# Patient Record
Sex: Female | Born: 1995 | Race: Black or African American | Hispanic: No | Marital: Single
Health system: Midwestern US, Community
[De-identification: ages and names within clinical notes are randomized; demographics above are authoritative.]

## PROBLEM LIST (undated history)

## (undated) DIAGNOSIS — A749 Chlamydial infection, unspecified: Secondary | ICD-10-CM

---
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
## 2015-05-25 ENCOUNTER — Emergency Department (HOSPITAL_COMMUNITY): Payer: Medicaid Other

## 2015-05-25 ENCOUNTER — Encounter (HOSPITAL_COMMUNITY): Payer: Self-pay

## 2015-05-25 ENCOUNTER — Emergency Department (HOSPITAL_COMMUNITY)
Admission: EM | Admit: 2015-05-25 | Discharge: 2015-05-25 | Disposition: A | Discharge status: Home / Self Care | Payer: Medicaid Other | Attending: Emergency Medicine | Admitting: Emergency Medicine

## 2015-05-25 DIAGNOSIS — S3992XA Unspecified injury of lower back, initial encounter: Secondary | ICD-10-CM | POA: Diagnosis present

## 2015-05-25 DIAGNOSIS — Y998 Other external cause status: Secondary | ICD-10-CM | POA: Diagnosis not present

## 2015-05-25 DIAGNOSIS — S39012A Strain of muscle, fascia and tendon of lower back, initial encounter: Secondary | ICD-10-CM | POA: Diagnosis not present

## 2015-05-25 DIAGNOSIS — Y9389 Activity, other specified: Secondary | ICD-10-CM | POA: Insufficient documentation

## 2015-05-25 DIAGNOSIS — Z3202 Encounter for pregnancy test, result negative: Secondary | ICD-10-CM | POA: Insufficient documentation

## 2015-05-25 DIAGNOSIS — Y92410 Unspecified street and highway as the place of occurrence of the external cause: Secondary | ICD-10-CM | POA: Diagnosis not present

## 2015-05-25 LAB — URINALYSIS, ROUTINE W REFLEX MICROSCOPIC
BILIRUBIN URINE: NEGATIVE
GLUCOSE, UA: NEGATIVE mg/dL
KETONES UR: NEGATIVE mg/dL
NITRITE: NEGATIVE
PH: 6 (ref 5.0–8.0)
Protein, ur: NEGATIVE mg/dL
Specific Gravity, Urine: 1.039 — ABNORMAL HIGH (ref 1.005–1.030)
Urobilinogen, UA: 1 mg/dL (ref 0.0–1.0)

## 2015-05-25 LAB — URINE MICROSCOPIC-ADD ON

## 2015-05-25 LAB — PREGNANCY, URINE: PREG TEST UR: NEGATIVE
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
## 2015-05-25 NOTE — Discharge Instructions (Signed)

## 2016-03-03 ENCOUNTER — Inpatient Hospital Stay
Admit: 2016-03-03 | Discharge: 2016-03-03 | Disposition: A | Discharge status: Home / Self Care | Payer: Self-pay | Attending: Emergency Medicine

## 2016-03-03 DIAGNOSIS — R102 Pelvic and perineal pain: Secondary | ICD-10-CM

## 2016-03-03 LAB — URINALYSIS W/ REFLEX CULTURE
Bacteria: NEGATIVE /hpf
Bilirubin: NEGATIVE
Blood: NEGATIVE
Glucose: NEGATIVE mg/dL
Ketone: NEGATIVE mg/dL
Leukocyte Esterase: NEGATIVE
Nitrites: NEGATIVE
Protein: NEGATIVE mg/dL
Specific gravity: 1.016 (ref 1.003–1.030)
Urobilinogen: 0.2 EU/dL (ref 0.2–1.0)
pH (UA): 7.5 (ref 5.0–8.0)

## 2016-03-03 LAB — HCG URINE, QL. - POC: Pregnancy test,urine (POC): NEGATIVE

## 2016-03-03 NOTE — ED Provider Notes (Signed)
Patient is a 20 y.o. female presenting with pelvic pain. The history is provided by the patient.   Pelvic Pain    This is a recurrent problem. The current episode started 6 to 12 hours ago. The problem occurs constantly. The problem has been gradually worsening. The pain is associated with an unknown factor. The pain is located in the LLQ and RLQ. The quality of the pain is dull and cramping. The pain is at a severity of 8/10. The pain is severe. Associated symptoms include back pain. Pertinent negatives include no belching, no diarrhea, no nausea, no vomiting, no constipation, no dysuria, no frequency and no chest pain. Nothing worsens the pain. The pain is relieved by nothing. Past workup includes ultrasound.        Past Medical History:   Diagnosis Date   ??? Psychiatric disorder     panic attack, anxiety       No past surgical history on file.      No family history on file.    Social History     Social History   ??? Marital status: SINGLE     Spouse name: N/A   ??? Number of children: N/A   ??? Years of education: N/A     Occupational History   ??? Not on file.     Social History Main Topics   ??? Smoking status: Never Smoker   ??? Smokeless tobacco: Not on file   ??? Alcohol use No   ??? Drug use: Not on file   ??? Sexual activity: Not on file     Other Topics Concern   ??? Not on file     Social History Narrative         ALLERGIES: Review of patient's allergies indicates no known allergies.    Review of Systems   Constitutional: Positive for activity change.   HENT: Negative.    Eyes: Negative.    Respiratory: Negative.    Cardiovascular: Negative for chest pain.   Gastrointestinal: Negative for constipation, diarrhea, nausea and vomiting.   Endocrine: Negative.    Genitourinary: Positive for flank pain, menstrual problem and pelvic pain. Negative for dysuria and frequency.   Musculoskeletal: Positive for back pain.   Skin: Negative.    Allergic/Immunologic: Negative.    Neurological: Negative.    Hematological: Negative.     Psychiatric/Behavioral: Negative.        Vitals:    03/03/16 1440   BP: 134/82   Pulse: (!) 101   Resp: 19   Temp: 98.6 ??F (37 ??C)   SpO2: 99%   Weight: 62.1 kg (136 lb 14.5 oz)   Height:  (1.702 m)            Physical Exam   Constitutional: She is oriented to person, place, and time. She appears well-developed and well-nourished.   HENT:   Head: Normocephalic and atraumatic.   Cardiovascular: Normal rate and regular rhythm.    Pulmonary/Chest: Effort normal.   Abdominal: Soft. Bowel sounds are normal.   Musculoskeletal: Normal range of motion.   Neurological: She is alert and oriented to person, place, and time.   Skin: Skin is warm and dry.   Psychiatric: She has a normal mood and affect. Her behavior is normal. Judgment and thought content normal.   Nursing note and vitals reviewed.       MDM  Number of Diagnoses or Management Options  Pelvic pain:   Diagnosis management comments:     Pelvic Pain  LMP: January 23, 2016  UA  UPT: Negative  GC/Chlam: Pending    Seen by & discussed with Dr. Janace ArisM. Bartholowmew    Follow-up with PCP  Financial Screening needed      ED Course       Procedures

## 2016-03-03 NOTE — Progress Notes (Signed)
Attempted to reach patient. Unable to complete call due to restrictions on line per message.  Will send registered letter

## 2016-03-03 NOTE — ED Triage Notes (Signed)
Pain in pelvis from ovarian cyst started around 10 AM today, reports it always comes and goes but today it is constant. Reports pain on both sides.

## 2016-03-07 LAB — CHLAMYDIA/GC PCR
Chlamydia amplified: POSITIVE — AB
N. gonorrhea, amplified: NEGATIVE
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
## 2018-01-04 ENCOUNTER — Inpatient Hospital Stay
Admit: 2018-01-04 | Discharge: 2018-01-04 | Disposition: A | Discharge status: Home / Self Care | Payer: Self-pay | Attending: Emergency Medicine

## 2018-01-04 DIAGNOSIS — S060X0A Concussion without loss of consciousness, initial encounter: Secondary | ICD-10-CM

## 2018-01-04 MED ORDER — IBUPROFEN 800 MG TAB
800 mg | ORAL_TABLET | Freq: Four times a day (QID) | ORAL | 0 refills | Status: AC | PRN
Start: 2018-01-04 — End: 2018-01-11

## 2018-01-04 MED ORDER — IBUPROFEN 400 MG TAB
400 mg | ORAL | Status: AC
Start: 2018-01-04 — End: 2018-01-04
  Administered 2018-01-04: 23:00:00 via ORAL

## 2018-01-04 MED FILL — IBUPROFEN 400 MG TAB: 400 mg | ORAL | Qty: 2

## 2018-01-04 NOTE — ED Notes (Signed)
 Formatting of this note is different from the original.  Patient here with c/o headache.  Patient states she was getting into her car on Wednesday (2 days ago) when she hit the right side of her head on the car while getting in.  Patient states that she was feeling better for a short time but then states that the head pain came back the next day (yesterday).  Patient states that she hit the right side of her head however the next day the headache was on both sides of her head.  Patient describes pain as throbbing.  Patient denies LOC however states but I hit it real good and my mom told me not to lay down because it might be a concussion.  Patient states that she wears glasses but denies any vision changes.  Denies fevers.  Patient reports intermittent nausea however denies vomiting.      Emergency Department Nursing Plan of Care     The Nursing Plan of Care is developed from the Nursing assessment and Emergency Department Attending provider initial evaluation.  The plan of care may be reviewed in the ?ED Provider note?SABRA    The Plan of Care was developed with the following considerations:   Patient / Family readiness to learn indicated ab:czmajopszi understanding  Persons(s) to be included in education: patient  Barriers to Learning/Limitations:No    Signed     Erroll LITTIE Pizza, RN    01/04/2018   6:15 PM    Electronically signed by Pizza Erroll LITTIE, RN at 01/04/2018  7:44 PM EDT

## 2018-01-04 NOTE — ED Provider Notes (Signed)
 Formatting of this note is different from the original.  Images from the original note were not included.  EMERGENCY DEPARTMENT HISTORY AND PHYSICAL EXAM    Date: 01/04/2018  Patient Name: Crystal Waller    History of Presenting Illness     Chief Complaint   Patient presents with   ? Migraine     pt c/o headache,hx of migraine,reported hit her head x 2 days ago to her car door.,also c/o dizzy.     History Provided By: Patient    HPI: Crystal Waller, 22 y.o. female with PMHx significant for anxiety, presents ambulatory to the ED with cc of mild to moderate, throbbing HA with associated dizziness and nausea s/p injury 2 days ago. Pt states she hit the R side of her head on her car. She denies LOC. She reports exacerbation in symptoms after going to work today. She reports occasional tobacco use. She states she is currently on her period. Pt specifically denies any vision changes, vomiting, CP, SOB.     There are no other complaints, changes, or physical findings at this time.    PCP: None    No current facility-administered medications on file prior to encounter.      No current outpatient medications on file prior to encounter.     Past History     Past Medical History:  Past Medical History:   Diagnosis Date   ? Psychiatric disorder     panic attack, anxiety     Past Surgical History:  History reviewed. No pertinent surgical history.    Family History:  History reviewed. No pertinent family history.    Social History:  Social History     Tobacco Use   ? Smoking status: Current Some Day Smoker   ? Smokeless tobacco: Never Used   Substance Use Topics   ? Alcohol use: No   ? Drug use: Not on file     Allergies:  Allergies   Allergen Reactions   ? Flagyl [Metronidazole] Other (comments)     Anorexia, VA changes, insomnia, near-syncope     Review of Systems   Review of Systems   Constitutional: Negative for fever.   HENT: Negative for sore throat.    Eyes: Negative for photophobia and redness.   Respiratory: Negative for  shortness of breath and wheezing.    Cardiovascular: Negative for chest pain and leg swelling.   Gastrointestinal: Positive for nausea. Negative for abdominal pain, blood in stool and vomiting.   Genitourinary: Negative for difficulty urinating, dysuria, hematuria, menstrual problem and vaginal bleeding.   Musculoskeletal: Negative for back pain and joint swelling.   Neurological: Positive for dizziness and headaches. Negative for seizures, syncope, speech difficulty, weakness and numbness.   Hematological: Negative for adenopathy.   Psychiatric/Behavioral: Negative for agitation, confusion and suicidal ideas. The patient is not nervous/anxious.      Physical Exam   Physical Exam   Constitutional: She is oriented to person, place, and time. She appears well-developed and well-nourished. No distress.   HENT:   Head: Normocephalic.   Mouth/Throat: Oropharynx is clear and moist. No oropharyngeal exudate.   Tenderness to R temple area   Eyes: Pupils are equal, round, and reactive to light. Conjunctivae and EOM are normal. Left eye exhibits no discharge.   Neck: Normal range of motion. Neck supple. No JVD present.   Cardiovascular: Normal rate, regular rhythm, normal heart sounds and intact distal pulses.   Pulmonary/Chest: Effort normal and breath sounds normal. No respiratory distress.  She has no wheezes.   Abdominal: Soft. Bowel sounds are normal. She exhibits no distension. There is no tenderness. There is no rebound and no guarding.   Musculoskeletal: Normal range of motion. She exhibits no edema or tenderness.   Lymphadenopathy:     She has no cervical adenopathy.   Neurological: She is alert and oriented to person, place, and time. She has normal reflexes. No cranial nerve deficit.   Skin: Skin is warm and dry. No rash noted.   Psychiatric: She has a normal mood and affect. Her behavior is normal.   Nursing note and vitals reviewed.    Medical Decision Making   I am the first provider for this patient.    I  reviewed the vital signs, available nursing notes, past medical history, past surgical history, family history and social history.    Vital Signs-Reviewed the patient's vital signs.  Patient Vitals for the past 12 hrs:   Temp Pulse Resp BP SpO2   01/04/18 1654 98.8 F (37.1 C) 97 17 137/77 100 %     Records Reviewed: Nursing Notes and Old Medical Records    Provider Notes (Medical Decision Making):   DDx: minor head injury, concussion, scalp contusion    ED Course:   Initial assessment performed. The patients presenting problems have been discussed, and they are in agreement with the care plan formulated and outlined with them.  I have encouraged them to ask questions as they arise throughout their visit.        Critical Care Time:   none    Disposition:  DISCHARGE  6:22 PM  The patient has been re-evaluated and is ready for discharge. Reviewed available results with patient. Counseled pt on diagnosis and care plan. Pt has expressed understanding, and all questions have been answered. Pt agrees with plan and agrees to follow up as recommended, or return to the ED if their symptoms worsen. Discharge instructions have been provided and explained to the pt, along with reasons to return to the ED.    PLAN:  1.   Current Discharge Medication List     START taking these medications    Details   ibuprofen (MOTRIN) 800 mg tablet Take 1 Tab by mouth every six (6) hours as needed for Pain for up to 7 days.  Qty: 20 Tab, Refills: 0         2.   Follow-up Information     Follow up With Specialties Details Why Contact Info    None    None ([phone]) Patient stated that they have no PCP     [facility] EMERGENCY DEPT Emergency Medicine In 3 days  [address]  [place]  [address]  [phone]       Return to ED if worse     Diagnosis     Clinical Impression:   1. Contusion of scalp, initial encounter    2. Concussion without loss of consciousness, initial encounter      Attestations:  This note is prepared by Lum Grade, acting as  Scribe for Raynell JINNY Lax, MD.    Raynell JINNY Lax, MD: The scribe's documentation has been prepared under my direction and personally reviewed by me in its entirety. I confirm that the note above accurately reflects all work, treatment, procedures, and medical decision making performed by me.      Electronically signed by Zaiter, Pedro J, MD at 01/04/2018  6:23 PM EDT

## 2018-01-04 NOTE — ED Notes (Signed)
 Formatting of this note might be different from the original.  Patient (s)  given copy of dc instructions and 0 paper script(s) and 1 electronic scripts. Patient (s)  verbalized understanding of instructions and script (s). Patient given a current medication reconciliation form and verbalized understanding of their medications. Patient (s) verbalized understanding of the importance of discussing medications with his or her physician or clinic they will be following up with. Patient alert and oriented and in no acute distress. Patient offered wheelchair from treatment area to hospital entrance, patient denied wheelchair.       Electronically signed by Tobie Alica LABOR, RN at 01/04/2018  6:50 PM EDT

## 2018-01-04 NOTE — ED Provider Notes (Signed)
EMERGENCY DEPARTMENT HISTORY AND PHYSICAL EXAM      Date: 01/04/2018  Patient Name: Crystal Waller    History of Presenting Illness     Chief Complaint   Patient presents with   ??? Migraine     pt c/o headache,hx of migraine,reported hit her head x 2 days ago to her car door.,also c/o dizzy.       History Provided By: Patient    HPI: Crystal Waller, 22 y.o. female with PMHx significant for anxiety, presents ambulatory to the ED with cc of mild to moderate, throbbing HA with associated dizziness and nausea s/p injury 2 days ago. Pt states she hit the R side of her head on her car. She denies LOC. She reports exacerbation in symptoms after going to work today. She reports occasional tobacco use. She states she is currently on her period. Pt specifically denies any vision changes, vomiting, CP, SOB.     There are no other complaints, changes, or physical findings at this time.    PCP: None    No current facility-administered medications on file prior to encounter.      No current outpatient medications on file prior to encounter.       Past History     Past Medical History:  Past Medical History:   Diagnosis Date   ??? Psychiatric disorder     panic attack, anxiety       Past Surgical History:  History reviewed. No pertinent surgical history.    Family History:  History reviewed. No pertinent family history.    Social History:  Social History     Tobacco Use   ??? Smoking status: Current Some Day Smoker   ??? Smokeless tobacco: Never Used   Substance Use Topics   ??? Alcohol use: No   ??? Drug use: Not on file       Allergies:  Allergies   Allergen Reactions   ??? Flagyl [Metronidazole] Other (comments)     Anorexia, VA changes, insomnia, near-syncope         Review of Systems   Review of Systems   Constitutional: Negative for fever.   HENT: Negative for sore throat.    Eyes: Negative for photophobia and redness.   Respiratory: Negative for shortness of breath and wheezing.     Cardiovascular: Negative for chest pain and leg swelling.   Gastrointestinal: Positive for nausea. Negative for abdominal pain, blood in stool and vomiting.   Genitourinary: Negative for difficulty urinating, dysuria, hematuria, menstrual problem and vaginal bleeding.   Musculoskeletal: Negative for back pain and joint swelling.   Neurological: Positive for dizziness and headaches. Negative for seizures, syncope, speech difficulty, weakness and numbness.   Hematological: Negative for adenopathy.   Psychiatric/Behavioral: Negative for agitation, confusion and suicidal ideas. The patient is not nervous/anxious.        Physical Exam   Physical Exam   Constitutional: She is oriented to person, place, and time. She appears well-developed and well-nourished. No distress.   HENT:   Head: Normocephalic.   Mouth/Throat: Oropharynx is clear and moist. No oropharyngeal exudate.   Tenderness to R temple area   Eyes: Pupils are equal, round, and reactive to light. Conjunctivae and EOM are normal. Left eye exhibits no discharge.   Neck: Normal range of motion. Neck supple. No JVD present.   Cardiovascular: Normal rate, regular rhythm, normal heart sounds and intact distal pulses.   Pulmonary/Chest: Effort normal and breath sounds normal. No respiratory distress. She has no  wheezes.   Abdominal: Soft. Bowel sounds are normal. She exhibits no distension. There is no tenderness. There is no rebound and no guarding.   Musculoskeletal: Normal range of motion. She exhibits no edema or tenderness.   Lymphadenopathy:     She has no cervical adenopathy.   Neurological: She is alert and oriented to person, place, and time. She has normal reflexes. No cranial nerve deficit.   Skin: Skin is warm and dry. No rash noted.   Psychiatric: She has a normal mood and affect. Her behavior is normal.   Nursing note and vitals reviewed.      Medical Decision Making   I am the first provider for this patient.     I reviewed the vital signs, available nursing notes, past medical history, past surgical history, family history and social history.    Vital Signs-Reviewed the patient's vital signs.  Patient Vitals for the past 12 hrs:   Temp Pulse Resp BP SpO2   01/04/18 1654 98.8 ??F (37.1 ??C) 97 17 137/77 100 %       Records Reviewed: Nursing Notes and Old Medical Records    Provider Notes (Medical Decision Making):   DDx: minor head injury, concussion, scalp contusion    ED Course:   Initial assessment performed. The patients presenting problems have been discussed, and they are in agreement with the care plan formulated and outlined with them.  I have encouraged them to ask questions as they arise throughout their visit.         Critical Care Time:   none    Disposition:  DISCHARGE  6:22 PM  The patient has been re-evaluated and is ready for discharge. Reviewed available results with patient. Counseled pt on diagnosis and care plan. Pt has expressed understanding, and all questions have been answered. Pt agrees with plan and agrees to follow up as recommended, or return to the ED if their symptoms worsen. Discharge instructions have been provided and explained to the pt, along with reasons to return to the ED.    PLAN:  1.   Current Discharge Medication List      START taking these medications    Details   ibuprofen (MOTRIN) 800 mg tablet Take 1 Tab by mouth every six (6) hours as needed for Pain for up to 7 days.  Qty: 20 Tab, Refills: 0           2.   Follow-up Information     Follow up With Specialties Details Why Contact Info    None    None ([phone]) Patient stated that they have no PCP      [facility] Emergency Medicine In 3 days  [address]  [place] [id number]  [phone]        Return to ED if worse     Diagnosis     Clinical Impression:   1. Contusion of scalp, initial encounter    2. Concussion without loss of consciousness, initial encounter        Attestations:   This note is prepared by Algie CofferMadison Isbell, acting as Scribe for Alfonse SprucePedro J Laura-Lee Villegas, MD.    Alfonse SprucePedro J Ed Mandich, MD: The scribe's documentation has been prepared under my direction and personally reviewed by me in its entirety. I confirm that the note above accurately reflects all work, treatment, procedures, and medical decision making performed by me.

## 2018-01-04 NOTE — ED Notes (Signed)
Patient (s)  given copy of dc instructions and 0 paper script(s) and 1 electronic scripts. Patient (s)  verbalized understanding of instructions and script (s). Patient given a current medication reconciliation form and verbalized understanding of their medications. Patient (s) verbalized understanding of the importance of discussing medications with his or her physician or clinic they will be following up with. Patient alert and oriented and in no acute distress. Patient offered wheelchair from treatment area to hospital entrance, patient denied wheelchair.

## 2018-01-04 NOTE — ED Notes (Signed)
Patient here with c/o headache.  Patient states she was getting into her car on Wednesday (2 days ago) when she hit the right side of her head on the car while getting in.  Patient states that she was feeling better for a short time but then states that the head pain came back the next day (yesterday).  Patient states that she hit the right side of her head however the next day the headache was on both sides of her head.  Patient describes pain as throbbing.  Patient denies LOC however states "but I hit it real good and my mom told me not to lay down because it might be a concussion."  Patient states that she wears glasses but denies any vision changes.  Denies fevers.  Patient reports intermittent nausea however denies vomiting.        Emergency Department Nursing Plan of Care       The Nursing Plan of Care is developed from the Nursing assessment and Emergency Department Attending provider initial evaluation.  The plan of care may be reviewed in the ???ED Provider note???.    The Plan of Care was developed with the following considerations:   Patient / Family readiness to learn indicated [id number]:verbalized understanding  Persons(s) to be included in education: patient  Barriers to Learning/Limitations:No    Signed     Darnelle Catalanerial L White, RN    01/04/2018   6:15 PM

## 2024-05-19 ENCOUNTER — Ambulatory Visit: Admit: 2024-05-19 | Payer: Medicaid (Managed Care) | Primary: Diagnostic Radiology

## 2024-05-19 ENCOUNTER — Ambulatory Visit: Admit: 2024-05-19 | Discharge: 2024-05-19 | Payer: Medicaid (Managed Care) | Primary: Diagnostic Radiology

## 2024-05-19 DIAGNOSIS — R0781 Pleurodynia: Principal | ICD-10-CM

## 2024-05-19 MED ORDER — DICLOFENAC SODIUM 50 MG PO TBEC
50 | ORAL_TABLET | Freq: Three times a day (TID) | ORAL | 0 refills | 90.00000 days | Status: AC
Start: 2024-05-19 — End: 2024-05-29

## 2024-05-19 MED ORDER — LIDOCAINE 5 % EX PTCH
5 | MEDICATED_PATCH | Freq: Every day | CUTANEOUS | 0 refills | 10.00000 days | Status: AC
Start: 2024-05-19 — End: 2024-05-29

## 2024-05-19 NOTE — Patient Instructions (Signed)
 Treat symptomatically   Tylenol/ibuprofen as needed for pain  Brace yourself with blankets or pillows when coughing or sneezing, avoid activities with high risk of falling or impact.   Make sure to take deep breaths  Follow up with PCP for reevaluation within the next several weeks.   Monitor closely for worsening signs and symptoms: shortness of breath, chest pain, dizziness, headaches, difficulty breathing... go to the ED if these develop.

## 2024-05-19 NOTE — Progress Notes (Signed)
 05/19/2024   Crystal Waller (DOB: Jul 26, 1996) is a 28 y.o. 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     Assessment & Plan  Rib pain on right side   Patient presenting with right sided rib pain most likely rib contusion after a fall. Xray series of the right ribs ordered. No xray imaging available at facility, sent patient to [facility] location for imaging. Xray imaging showed no acute abnormalities and results were discussed with patient over the phone. Patient advised if she has a rib fracture treatment is the same as a rib contusion which includes symptomatic treatment and support with a pillow. Work note given. ER precautions advised. Patient advised to follow up with PCP if symptoms continue to persist or worsen. Patient verbalizes agreement and understanding with plan of care.     Orders:    diclofenac  (VOLTAREN ) 50 MG EC tablet; Take 1 tablet by mouth 3 times daily (with meals) for 10 days    lidocaine  (LIDODERM ) 5 %; Place 1 patch onto the skin daily for 10 days 12 hours on, 12 hours off.    XR RIBS RIGHT INCLUDE CHEST (MIN 3 VIEWS)    Elevated blood pressure reading   I discussed with patient the elevated blood pressure readings.  We reviewed activities to reduce blood pressure to include low salt diet, exercise, weight loss, healthy diet and alcohol avoidance.  Patient understands.  Will follow up with PCP as needed.            Handout given with care instructions  OTC for symptom management. Increase fluid intake, ensure adequate nutritional intake.  Follow up with PCP as needed.  Go to ED with development of any acute symptoms.     Follow up:  No follow-ups on file.  Follow up  immediately for any new, worsening or changes or if symptoms are not improving over the next 5-7 days.     Subjective:    28 year old female presented for right sided rib pain after a fall on 05/13/2024. Patient states she tripped and fell outside on the sidewalk and fell on right elbow. Patient states she has continued and worsening right sided rib pain. Patient reports increased pain when breathing in. Denies shortness of breath or difficulty breathing. No other acute symptoms reported at this time.       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    HENT: Negative.     Eyes: Negative.    Respiratory: Negative.     Cardiovascular:         Positive for right rib pain.   Gastrointestinal: Negative.    Endocrine: Negative.    Genitourinary: Negative.    Musculoskeletal: Negative.    Skin: Negative.    Allergic/Immunologic: Negative.    Neurological: Negative.    Hematological: Negative.    Psychiatric/Behavioral: Negative.  Reviewed PmHx, RxHx, FmHx, SocHx, AllgHx and updated in chart.  History reviewed. No pertinent family history.  History reviewed. No pertinent past medical history.   Social History     Socioeconomic History    Marital status: Single     Spouse name: None    Number of children: None    Years of education: None    Highest education level: None   Tobacco Use    Smoking status: Every Day     Types: Cigarettes    Smokeless tobacco: Never   Substance and Sexual Activity    Drug use: Never        Allergies   Allergen Reactions    Metronidazole Other (See Comments)     Anorexia, VA changes, insomnia, near-syncope       Current Outpatient Medications   Medication Sig Dispense Refill    metFORMIN (GLUCOPHAGE) 500 MG tablet TAKE 1 TABLET BY MOUTH ONCE DAILY, INCREASE TO 2  TABLETS DAILY AFTER 1 TO 2 WEEKS      letrozole (FEMARA) 2.5 MG tablet TAKE 1 TABLET BY MOUTH TWICE DAILY ON CYCLE DAYS 3 TO 7      diclofenac  (VOLTAREN ) 50 MG EC tablet Take 1 tablet by mouth 3 times daily (with meals) for 10 days 30 tablet 0    lidocaine  (LIDODERM ) 5 % Place 1 patch onto the skin daily for 10 days 12 hours on, 12 hours off. 10 patch 0     No current facility-administered medications for this visit.        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   Constitutional:       General: She is not in acute distress.     Appearance: Normal appearance. She is not ill-appearing, toxic-appearing or diaphoretic.   HENT:      Mouth/Throat:      Mouth: Mucous membranes are moist.   Eyes:      Conjunctiva/sclera: Conjunctivae normal.   Cardiovascular:      Rate and Rhythm: Normal rate.   Pulmonary:      Effort: Pulmonary effort is normal. No respiratory distress.      Breath sounds: No stridor. No wheezing, rhonchi or rales.   Chest:      Chest wall: Tenderness present. No mass, lacerations, deformity, swelling, crepitus or edema. There is no dullness to percussion.      Comments: Right sided ecchymosis noted underneath right breast.  Skin:     General: Skin is warm.      Capillary Refill: Capillary refill takes less than 2 seconds.   Neurological:      Mental Status: She is alert and oriented to person, place, and time.      Motor: No weakness.      Gait: Gait normal.  Test Results:  No results found for this or any previous visit (from the past 6 hours).  XR  Results (most recent):  Results for orders placed or performed in visit on 05/19/24   XR RIBS RIGHT INCLUDE CHEST (MIN 3 VIEWS)    Narrative    Examination  Multiple views of the right ribs and PA chest    Comparisons:  None provided.      Findings  There is no rib fracture identified.    The lungs are clear. No consolidation is present.     No pleural effusions are noted.     There is no pneumothorax present.       Impression    No evidence for rib fracture.    Electronically signed by: Jayson RAMAN. Fairbanks, D.O. on 05/19/2024 05:42:32 PM   [facility] [place]         I ADVISED PATIENT TO GO TO ER IF SYMPTOMS WORSEN , CHANGE OR FAILS TO IMPROVE.    I have discussed the diagnosis with the patient and the intended plan as seen in the above orders.  The patient also understands that early in the process of an illness, the urgent care workup can be falsely reassuring. Routine discharge counseling and specific return precautions dicussed with the patient and the patient understand that worsening, changing or persistent symptoms should prompt an immediate return to an urgent care or emergency department. Patient/Guardian expressed understanding and agrees with the discharge instructions. No further questions at this time of discharge.  The patient has received an after-visit summary and questions were answered concerning future plans.  I have discussed medication side effects and warnings with the patient as well. The patient agrees and understands above plan.            (Please note that parts of this dictation were completed with voice recognition software. Quite often unanticipated grammatical, syntax, homophones, and other interpretive errors are inadvertently transcribed by the computer software. Efforts were made to edit the dictation but occasionally words remain mis-transcribed.)     An electronic signature was used to authenticate this note.  -- Zelda Haber, PA-C
